# Patient Record
Sex: Female | Born: 1995 | Race: Black or African American | Hispanic: No | Marital: Single | Smoking: Never smoker
Health system: Southern US, Community
[De-identification: ages and names within clinical notes are randomized; demographics above are authoritative.]

## PROBLEM LIST (undated history)

## (undated) DIAGNOSIS — D649 Anemia, unspecified: Secondary | ICD-10-CM

---

## 2017-12-22 ENCOUNTER — Other Ambulatory Visit: Payer: Self-pay

## 2017-12-22 ENCOUNTER — Emergency Department (HOSPITAL_BASED_OUTPATIENT_CLINIC_OR_DEPARTMENT_OTHER)
Admission: EM | Admit: 2017-12-22 | Discharge: 2017-12-22 | Disposition: A | Discharge status: Home / Self Care | Payer: Self-pay | Attending: Emergency Medicine | Admitting: Emergency Medicine

## 2017-12-22 ENCOUNTER — Encounter (HOSPITAL_BASED_OUTPATIENT_CLINIC_OR_DEPARTMENT_OTHER): Payer: Self-pay

## 2017-12-22 ENCOUNTER — Emergency Department (HOSPITAL_BASED_OUTPATIENT_CLINIC_OR_DEPARTMENT_OTHER): Payer: Self-pay

## 2017-12-22 DIAGNOSIS — S0990XA Unspecified injury of head, initial encounter: Secondary | ICD-10-CM | POA: Insufficient documentation

## 2017-12-22 DIAGNOSIS — Y939 Activity, unspecified: Secondary | ICD-10-CM | POA: Insufficient documentation

## 2017-12-22 DIAGNOSIS — Y929 Unspecified place or not applicable: Secondary | ICD-10-CM | POA: Insufficient documentation

## 2017-12-22 DIAGNOSIS — Y999 Unspecified external cause status: Secondary | ICD-10-CM | POA: Insufficient documentation

## 2017-12-22 DIAGNOSIS — M791 Myalgia, unspecified site: Secondary | ICD-10-CM | POA: Insufficient documentation

## 2017-12-22 DIAGNOSIS — M7918 Myalgia, other site: Secondary | ICD-10-CM

## 2017-12-22 DIAGNOSIS — M545 Low back pain, unspecified: Secondary | ICD-10-CM

## 2017-12-22 DIAGNOSIS — M542 Cervicalgia: Secondary | ICD-10-CM | POA: Insufficient documentation

## 2017-12-22 LAB — PREGNANCY, URINE: PREG TEST UR: NEGATIVE

## 2017-12-22 MED ORDER — NAPROXEN 500 MG PO TABS: 500.0000 mg | ORAL_TABLET | Freq: Two times a day (BID) | ORAL | 0 refills | Status: DC
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
## 2019-01-24 ENCOUNTER — Emergency Department (HOSPITAL_COMMUNITY)
Admission: EM | Admit: 2019-01-24 | Discharge: 2019-01-24 | Discharge status: Against Medical Advice | Payer: Medicaid Other

## 2019-01-24 ENCOUNTER — Other Ambulatory Visit: Payer: Self-pay

## 2019-01-30 ENCOUNTER — Other Ambulatory Visit: Payer: Self-pay | Admitting: *Deleted

## 2019-01-30 DIAGNOSIS — Z20822 Contact with and (suspected) exposure to covid-19: Secondary | ICD-10-CM

## 2019-01-31 LAB — NOVEL CORONAVIRUS, NAA: SARS-CoV-2, NAA: NOT DETECTED
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
## 2021-04-02 ENCOUNTER — Emergency Department (HOSPITAL_BASED_OUTPATIENT_CLINIC_OR_DEPARTMENT_OTHER): Payer: Medicaid Other

## 2021-04-02 ENCOUNTER — Emergency Department (HOSPITAL_BASED_OUTPATIENT_CLINIC_OR_DEPARTMENT_OTHER)
Admission: EM | Admit: 2021-04-02 | Discharge: 2021-04-02 | Disposition: A | Discharge status: Home / Self Care | Payer: Medicaid Other | Attending: Emergency Medicine | Admitting: Emergency Medicine

## 2021-04-02 ENCOUNTER — Other Ambulatory Visit: Payer: Self-pay

## 2021-04-02 ENCOUNTER — Encounter (HOSPITAL_BASED_OUTPATIENT_CLINIC_OR_DEPARTMENT_OTHER): Payer: Self-pay | Admitting: *Deleted

## 2021-04-02 DIAGNOSIS — N938 Other specified abnormal uterine and vaginal bleeding: Secondary | ICD-10-CM | POA: Insufficient documentation

## 2021-04-02 DIAGNOSIS — Z332 Encounter for elective termination of pregnancy: Secondary | ICD-10-CM

## 2021-04-02 DIAGNOSIS — N939 Abnormal uterine and vaginal bleeding, unspecified: Secondary | ICD-10-CM

## 2021-04-02 DIAGNOSIS — O209 Hemorrhage in early pregnancy, unspecified: Secondary | ICD-10-CM | POA: Insufficient documentation

## 2021-04-02 DIAGNOSIS — Z3A01 Less than 8 weeks gestation of pregnancy: Secondary | ICD-10-CM | POA: Insufficient documentation

## 2021-04-02 DIAGNOSIS — N9489 Other specified conditions associated with female genital organs and menstrual cycle: Secondary | ICD-10-CM | POA: Diagnosis not present

## 2021-04-02 HISTORY — DX: Anemia, unspecified: D64.9

## 2021-04-02 LAB — HCG, QUANTITATIVE, PREGNANCY: hCG, Beta Chain, Quant, S: 11 m[IU]/mL — ABNORMAL HIGH (ref <5)

## 2021-04-02 LAB — PREGNANCY, URINE: Preg Test, Ur: NEGATIVE

## 2021-04-02 LAB — URINALYSIS, ROUTINE W REFLEX MICROSCOPIC
Bilirubin Urine: NEGATIVE
Glucose, UA: NEGATIVE mg/dL
Ketones, ur: NEGATIVE mg/dL
Nitrite: NEGATIVE
Protein, ur: 30 mg/dL — AB
Specific Gravity, Urine: 1.015 (ref 1.005–1.030)
pH: 7 (ref 5.0–8.0)

## 2021-04-02 LAB — CBC WITH DIFFERENTIAL/PLATELET
Abs Immature Granulocytes: 0.02 10*3/uL (ref 0.00–0.07)
Basophils Absolute: 0.1 10*3/uL (ref 0.0–0.1)
Basophils Relative: 1 %
Eosinophils Absolute: 0.1 10*3/uL (ref 0.0–0.5)
Eosinophils Relative: 2 %
HCT: 33 % — ABNORMAL LOW (ref 36.0–46.0)
Hemoglobin: 9.9 g/dL — ABNORMAL LOW (ref 12.0–15.0)
Immature Granulocytes: 0 %
Lymphocytes Relative: 34 %
Lymphs Abs: 2.4 10*3/uL (ref 0.7–4.0)
MCH: 22.2 pg — ABNORMAL LOW (ref 26.0–34.0)
MCHC: 30 g/dL (ref 30.0–36.0)
MCV: 74 fL — ABNORMAL LOW (ref 80.0–100.0)
Monocytes Absolute: 0.6 10*3/uL (ref 0.1–1.0)
Monocytes Relative: 8 %
Neutro Abs: 3.9 10*3/uL (ref 1.7–7.7)
Neutrophils Relative %: 55 %
Platelets: 230 10*3/uL (ref 150–400)
RBC: 4.46 MIL/uL (ref 3.87–5.11)
RDW: 16.7 % — ABNORMAL HIGH (ref 11.5–15.5)
WBC: 7.1 10*3/uL (ref 4.0–10.5)
nRBC: 0 % (ref 0.0–0.2)

## 2021-04-02 LAB — URINALYSIS, MICROSCOPIC (REFLEX): RBC / HPF: >50 RBC/hpf (ref 0–5)

## 2021-04-02 LAB — COMPREHENSIVE METABOLIC PANEL
ALT: 10 U/L (ref 0–44)
AST: 16 U/L (ref 15–41)
Albumin: 3.5 g/dL (ref 3.5–5.0)
Alkaline Phosphatase: 38 U/L (ref 38–126)
Anion gap: 7 (ref 5–15)
BUN: 10 mg/dL (ref 6–20)
CO2: 24 mmol/L (ref 22–32)
Calcium: 8.2 mg/dL — ABNORMAL LOW (ref 8.9–10.3)
Chloride: 103 mmol/L (ref 98–111)
Creatinine, Ser: 0.63 mg/dL (ref 0.44–1.00)
GFR, Estimated: >60 mL/min (ref >60)
Glucose, Bld: 86 mg/dL (ref 70–99)
Potassium: 3.6 mmol/L (ref 3.5–5.1)
Sodium: 134 mmol/L — ABNORMAL LOW (ref 135–145)
Total Bilirubin: 0.6 mg/dL (ref 0.3–1.2)
Total Protein: 7.2 g/dL (ref 6.5–8.1)

## 2021-04-02 NOTE — ED Provider Notes (Signed)
[facility] EMERGENCY DEPARTMENT Provider Note   CSN: [id number] Arrival date & time: 04/02/21  1658     History Chief Complaint  Patient presents with   Vaginal Bleeding    Veronica Blake is a 25 y.o. female.  HPI Patient is a 25 year old female with past medical history significant for anemia  Patient is presented to ER today with complaints of vaginal bleeding for 7 weeks she states that she had a medical abortion 7 weeks ago at a clinic in [place] and states that she called the clinic today because of her ongoing mild vaginal bleeding with occasional clots passing and states that she was told to come to the emergency room.  She denies any pain lightheadedness dizziness nausea vomiting abdominal pain she denies any syncope or near syncope.  No other associated symptoms.  She states she feels well.  No urinary symptoms such as hematuria urgency or frequency.  She also denies any changes in her bowel movements.  She states that she has a history of anemia and her hemoglobin baseline is around 8.     Past Medical History:  Diagnosis Date   Anemia     There are no problems to display for this patient.   History reviewed. No pertinent surgical history.   OB History   No obstetric history on file.     No family history on file.  Social History   Tobacco Use   Smoking status: Never   Smokeless tobacco: Never  Substance Use Topics   Alcohol use: Never   Drug use: Never    Home Medications Prior to Admission medications   Medication Sig Start Date End Date Taking? Authorizing Provider  naproxen (NAPROSYN) 500 MG tablet Take 1 tablet (500 mg total) by mouth 2 (two) times daily with a meal. 12/22/17   Caccavale, Sophia, PA-C    Allergies    Patient has no known allergies.  Review of Systems   Review of Systems  Constitutional:  Negative for chills and fever.  HENT:  Negative for congestion.   Eyes:  Negative for pain.  Respiratory:  Negative for  cough and shortness of breath.   Cardiovascular:  Negative for chest pain and leg swelling.  Gastrointestinal:  Negative for abdominal pain and vomiting.  Genitourinary:  Positive for vaginal bleeding. Negative for dysuria.  Musculoskeletal:  Negative for myalgias.  Skin:  Negative for rash.  Neurological:  Negative for dizziness and headaches.   Physical Exam Updated Vital Signs BP 120/68   Pulse 68   Temp 98.4 F (36.9 C) (Oral)   Resp 16   Ht 5\' 3"  (1.6 m)   Wt 56.7 kg   SpO2 99%   BMI 22.14 kg/m   Physical Exam Vitals and nursing note reviewed.  Constitutional:      General: She is not in acute distress. HENT:     Head: Normocephalic and atraumatic.     Nose: Nose normal.  Eyes:     General: No scleral icterus. Cardiovascular:     Rate and Rhythm: Normal rate and regular rhythm.     Pulses: Normal pulses.     Heart sounds: Normal heart sounds.  Pulmonary:     Effort: Pulmonary effort is normal. No respiratory distress.     Breath sounds: No wheezing.  Abdominal:     Palpations: Abdomen is soft.     Tenderness: There is no abdominal tenderness. There is no guarding or rebound.  Genitourinary:    Comments: Deferred by  patient Musculoskeletal:     Cervical back: Normal range of motion.     Right lower leg: No edema.     Left lower leg: No edema.  Skin:    General: Skin is warm and dry.     Capillary Refill: Capillary refill takes less than 2 seconds.  Neurological:     Mental Status: She is alert. Mental status is at baseline.  Psychiatric:        Mood and Affect: Mood normal.        Behavior: Behavior normal.    ED Results / Procedures / Treatments   Labs (all labs ordered are listed, but only abnormal results are displayed) Labs Reviewed  URINALYSIS, ROUTINE W REFLEX MICROSCOPIC - Abnormal; Notable for the following components:      Result Value   Color, Urine RED (*)    APPearance TURBID (*)    Hgb urine dipstick LARGE (*)    Protein, ur 30 (*)     Leukocytes,Ua TRACE (*)    All other components within normal limits  HCG, QUANTITATIVE, PREGNANCY - Abnormal; Notable for the following components:   hCG, Beta Chain, Quant, S 11 (*)    All other components within normal limits  CBC WITH DIFFERENTIAL/PLATELET - Abnormal; Notable for the following components:   Hemoglobin 9.9 (*)    HCT 33.0 (*)    MCV 74.0 (*)    MCH 22.2 (*)    RDW 16.7 (*)    All other components within normal limits  COMPREHENSIVE METABOLIC PANEL - Abnormal; Notable for the following components:   Sodium 134 (*)    Calcium 8.2 (*)    All other components within normal limits  URINALYSIS, MICROSCOPIC (REFLEX) - Abnormal; Notable for the following components:   Bacteria, UA RARE (*)    All other components within normal limits  PREGNANCY, URINE    EKG None  Radiology US Transvaginal Non-OB  Result Date: 04/02/2021 CLINICAL DATA:  Recent abortion with persistent bleeding. EXAM: Transvaginal ULTRASOUND OF PELVIS TECHNIQUE: Transvaginal ultrasound examination of the pelvis was performed including evaluation of the uterus, ovaries, adnexal regions, and pelvic cul-de-sac. COMPARISON:  None. FINDINGS: Uterus Measurements: 8.7 x 4.9 x 7.1 cm = volume: 160 mL. There is some vague focal hypodensity within the posterior myometrium which may be causing some indentation on the endometrium measuring approximally 3.0 x 1.5 x 2.5 cm. There is marked increased vascularity in this region. Endometrium Thickness: 6 mm.  No focal abnormality visualized. Right ovary Measurements: 3.1 x 1.5 x 2.0 cm = volume: 4.8 mL. Normal appearance/no adnexal mass. Left ovary Measurements: 4.0 x 2.3 x 2.1 cm = volume: 9.7 mL. Normal appearance/no adnexal mass. Other findings:  No abnormal free fluid. IMPRESSION: 1. Vague hypodensity within the posterior myometrium causing mass effect/indentation on the endometrium with marked increased vascularity. Findings may be related to uterine AVM. Gynecology  consultation recommended. 2. Endometrium is thin and otherwise appears within normal limits. 3. These results were called by telephone at the time of interpretation on 04/02/2021 at 8:04 pm to provider Dr.Dykstra, who verbally acknowledged these results. Electronically Signed   By: Darliss Cheney M.D.   On: 04/02/2021 20:05    Procedures Procedures   Medications Ordered in ED Medications - No data to display  ED Course  I have reviewed the triage vital signs and the nursing notes.  Pertinent labs & imaging results that were available during my care of the patient were reviewed by me and considered in  my medical decision making (see chart for details).    MDM Rules/Calculators/A&P                          Patient is 25 year old female no past surgical history has had a medical abortion 7 weeks ago and has had some mild continuous bleeding since that time she states occasionally she will pass large clots.  Denies any symptoms other than vaginal bleeding.  Denies any pain nausea vomiting or urinary symptoms such as urinary frequency urgency dysuria hematuria.  Physical exam unremarkable abdomen soft nontender.  Pelvic exam deferred by patient.  Pelvic ultrasound shows uterine AVM.  Patient may need interventional radiology down the road however can be discharged home at this time.  CBC shows anemia however actually hemoglobin is higher than her baseline which is 8 per patient I reviewed prior labs and see that she has had chronic anemia.  She is also having no lightheadedness dizziness shortness of breath chest pain or fatigue.  She states she feels well.  Physical exam is unremarkable.  CMP unremarkable.  Urinalysis consistent with vaginal bleeding with large hemoglobin and red color.  Discussed with Dr. Harolyn Rutherford of OB/GYN who recommended discharge home with follow-up with [facility]  Final Clinical Impression(s) / ED Diagnoses Final diagnoses:  Vaginal  bleeding  Medical abortion    Rx / DC Orders ED Discharge Orders     None        Tedd Sias, [place] 04/02/21 2036    Lucrezia Starch, MD 04/02/21 2133

## 2021-04-02 NOTE — Discharge Instructions (Addendum)
Your ultrasound shows that you have an abnormal vascular lesion in your uterus.  This is likely been here for some time.  You will need to follow-up with the OB/GYN I have given you information for the office here at [facility].  Please call tomorrow to make an appointment  Please take ibuprofen 600 mg every 6-8 hours.

## 2021-04-02 NOTE — ED Notes (Signed)
US bedside

## 2021-04-02 NOTE — ED Triage Notes (Addendum)
C/o  light vaginal bleeding  with clots x 7 weeks  after elective abortion @  22 weeks preg

## 2021-04-03 ENCOUNTER — Other Ambulatory Visit: Payer: Self-pay

## 2021-05-02 ENCOUNTER — Encounter (HOSPITAL_COMMUNITY): Payer: Self-pay | Admitting: Radiology

## 2021-05-07 ENCOUNTER — Encounter: Payer: Medicaid Other | Admitting: Obstetrics & Gynecology

## 2021-08-13 ENCOUNTER — Encounter (HOSPITAL_COMMUNITY): Payer: Self-pay | Admitting: Emergency Medicine

## 2021-08-13 ENCOUNTER — Other Ambulatory Visit: Payer: Self-pay

## 2021-08-13 ENCOUNTER — Emergency Department (HOSPITAL_COMMUNITY)
Admission: EM | Admit: 2021-08-13 | Discharge: 2021-08-13 | Disposition: A | Discharge status: Home / Self Care | Payer: Medicaid Other | Attending: Emergency Medicine | Admitting: Emergency Medicine

## 2021-08-13 DIAGNOSIS — O219 Vomiting of pregnancy, unspecified: Secondary | ICD-10-CM | POA: Diagnosis not present

## 2021-08-13 DIAGNOSIS — R7989 Other specified abnormal findings of blood chemistry: Secondary | ICD-10-CM | POA: Diagnosis not present

## 2021-08-13 DIAGNOSIS — Z3A01 Less than 8 weeks gestation of pregnancy: Secondary | ICD-10-CM | POA: Diagnosis not present

## 2021-08-13 DIAGNOSIS — O26891 Other specified pregnancy related conditions, first trimester: Secondary | ICD-10-CM | POA: Diagnosis not present

## 2021-08-13 DIAGNOSIS — K0889 Other specified disorders of teeth and supporting structures: Secondary | ICD-10-CM | POA: Insufficient documentation

## 2021-08-13 LAB — COMPREHENSIVE METABOLIC PANEL
ALT: 11 U/L (ref 0–44)
AST: 18 U/L (ref 15–41)
Albumin: 3.5 g/dL (ref 3.5–5.0)
Alkaline Phosphatase: 36 U/L — ABNORMAL LOW (ref 38–126)
Anion gap: 7 (ref 5–15)
BUN: 10 mg/dL (ref 6–20)
CO2: 20 mmol/L — ABNORMAL LOW (ref 22–32)
Calcium: 8.5 mg/dL — ABNORMAL LOW (ref 8.9–10.3)
Chloride: 108 mmol/L (ref 98–111)
Creatinine, Ser: 0.71 mg/dL (ref 0.44–1.00)
GFR, Estimated: >60 mL/min (ref >60)
Glucose, Bld: 94 mg/dL (ref 70–99)
Potassium: 3.5 mmol/L (ref 3.5–5.1)
Sodium: 135 mmol/L (ref 135–145)
Total Bilirubin: 0.5 mg/dL (ref 0.3–1.2)
Total Protein: 6.9 g/dL (ref 6.5–8.1)

## 2021-08-13 LAB — CBC WITH DIFFERENTIAL/PLATELET
Abs Immature Granulocytes: 0.03 10*3/uL (ref 0.00–0.07)
Basophils Absolute: 0.1 10*3/uL (ref 0.0–0.1)
Basophils Relative: 1 %
Eosinophils Absolute: 0.2 10*3/uL (ref 0.0–0.5)
Eosinophils Relative: 3 %
HCT: 27.7 % — ABNORMAL LOW (ref 36.0–46.0)
Hemoglobin: 8.1 g/dL — ABNORMAL LOW (ref 12.0–15.0)
Immature Granulocytes: 0 %
Lymphocytes Relative: 29 %
Lymphs Abs: 2.3 10*3/uL (ref 0.7–4.0)
MCH: 19 pg — ABNORMAL LOW (ref 26.0–34.0)
MCHC: 29.2 g/dL — ABNORMAL LOW (ref 30.0–36.0)
MCV: 64.9 fL — ABNORMAL LOW (ref 80.0–100.0)
Monocytes Absolute: 0.8 10*3/uL (ref 0.1–1.0)
Monocytes Relative: 10 %
Neutro Abs: 4.5 10*3/uL (ref 1.7–7.7)
Neutrophils Relative %: 57 %
Platelets: 232 10*3/uL (ref 150–400)
RBC: 4.27 MIL/uL (ref 3.87–5.11)
RDW: 19.2 % — ABNORMAL HIGH (ref 11.5–15.5)
Smear Review: ADEQUATE
WBC: 7.9 10*3/uL (ref 4.0–10.5)
nRBC: 0 % (ref 0.0–0.2)

## 2021-08-13 LAB — URINALYSIS, ROUTINE W REFLEX MICROSCOPIC
Bilirubin Urine: NEGATIVE
Glucose, UA: NEGATIVE mg/dL
Hgb urine dipstick: NEGATIVE
Ketones, ur: NEGATIVE mg/dL
Nitrite: NEGATIVE
Protein, ur: NEGATIVE mg/dL
Specific Gravity, Urine: 1.027 (ref 1.005–1.030)
pH: 5 (ref 5.0–8.0)

## 2021-08-13 LAB — I-STAT BETA HCG BLOOD, ED (MC, WL, AP ONLY): I-stat hCG, quantitative: >2000 m[IU]/mL — ABNORMAL HIGH (ref <5)

## 2021-08-13 LAB — LIPASE, BLOOD: Lipase: 38 U/L (ref 11–51)

## 2021-08-13 LAB — HCG, QUANTITATIVE, PREGNANCY: hCG, Beta Chain, Quant, S: 39137 m[IU]/mL — ABNORMAL HIGH (ref <5)

## 2021-08-13 MED ORDER — AMOXICILLIN-POT CLAVULANATE 875-125 MG PO TABS
1.0000 | ORAL_TABLET | Freq: Once | ORAL | Status: AC
  Administered 2021-08-13: 1 via ORAL
  Filled 2021-08-13: qty 1

## 2021-08-13 MED ORDER — ONDANSETRON 4 MG PO TBDP
4.0000 mg | ORAL_TABLET | Freq: Once | ORAL | Status: AC
  Administered 2021-08-13: 4 mg via ORAL
  Filled 2021-08-13: qty 1

## 2021-08-13 MED ORDER — DOXYLAMINE-PYRIDOXINE 10-10 MG PO TBEC: 1.0000 | DELAYED_RELEASE_TABLET | Freq: Every day | ORAL | 0 refills | Status: AC | PRN

## 2021-08-13 MED ORDER — AMOXICILLIN-POT CLAVULANATE 875-125 MG PO TABS: 1.0000 | ORAL_TABLET | Freq: Two times a day (BID) | ORAL | 0 refills | Status: DC

## 2021-08-13 NOTE — Discharge Instructions (Addendum)
You were seen here for evaluation of your dental pain. It is likely from your tooth that needs a root canal. I am prescribing you an antibiotic to take twice daily for the next week. You can take 1000mg  of Tylenol every 6 hours as needed for pain. Both of these should help with the inflammation and pain you are experiencing. Please schedule to see a dentist as they will be able to provide follow up and care. I have also sent in a prescription for Diclegis which is a medication for nausea and vomiting. I have provided some dental resources that you can call to schedule your procedure. If you have any trouble eating, swallowing, breathing, facial swelling, or drooling, please return to the ER for re-evaluation.  ? ?Contact a dental care provider if: ?You have any unexplained dental pain. ?Your pain is not controlled with medicines. ?Your symptoms get worse. ?You have new symptoms. ?Get help right away if: ?You are unable to open your mouth. ?You are having trouble breathing or swallowing. ?You have a fever. ?You notice that your face, neck, or jaw is swollen. ?These symptoms may represent a serious problem that is an emergency. Do not wait to see if the symptoms will go away. Get medical help right away. Call your local emergency services (911 in the [place] Do not drive yourself to the hospital. ?

## 2021-08-13 NOTE — ED Provider Triage Note (Signed)
Emergency Medicine Provider Triage Evaluation Note ? ?Veronica Blake , a 26 y.o. female  was evaluated in triage.  Pt complains of left sided dental pain.  States saw dentist already and is waiting for root canal.  Not currently on abx.  Also reports now she is vomiting.  Denies sick contacts.  Not sure why she is vomiting.  Denies fever/chills. ? ? ?Review of Systems  ?Positive: Dental pain, vomiting ?Negative: fever ? ?Physical Exam  ?BP 127/87   Pulse 77   Temp 98.7 ?F (37.1 ?C) (Oral)   Resp 18   SpO2 100%  ? ?Gen:   Awake, no distress   ?Resp:  Normal effort  ?MSK:   Moves extremities without difficulty  ?Other:  Dentition poor, left upper gingival swelling without localized dental abscess ? ?Medical Decision Making  ?Medically screening exam initiated at 4:16 AM.  Appropriate orders placed.  Veronica Blake was informed that the remainder of the evaluation will be completed by another provider, this initial triage assessment does not replace that evaluation, and the importance of remaining in the ED until their evaluation is complete. ? ?Dental pain, vomiting.  After initial triage, patient states she is approx [redacted] weeks pregnant but planning for abortion.  LMP 06/29/21. ?  ?Garlon Hatchet, PA-C ?08/13/21 [phone] ? ?

## 2021-08-13 NOTE — ED Notes (Signed)
Pt endorses pregnancy. LMP was 06/29/2021. States she does not plan to carry out pregnancy.  ?

## 2021-08-13 NOTE — ED Triage Notes (Signed)
Pt reported to ED with c/o lower left dental pain. Pt states she has also vomited. Denies any sick contacts.  ?

## 2021-08-13 NOTE — ED Provider Notes (Signed)
?[facility] ?Provider Note ? ? ?CSN: [id number] ?Arrival date & time: 08/13/21  0344 ? ?  ? ?History ?Chief Complaint  ?Patient presents with  ? Dental Pain  ? Vomiting  ? ? ?Veronica Blake is a 26 y.o. pregnant female  of unknown gestational age (last menstrual period mid February) presents to the ED for evaluation of dental pain intermittently for the past month, but worsened yesterday. She was seen by a dentist over a month ago and was told that she needed a root canal, but reports it was thousands of dollars that she could not afford. She reports that she does not have the pain every day, only every other day if that.  She reports she tried some extra and Tylenol as well as some Advil and a Goody's powder yesterday with only minimal relief.  She reports she was told to go to go the emergency department if she was having any pain that was not improved by Tylenol by her dentist.  She denies any fevers, trouble eating or drinking, trouble swallowing, trouble breathing, any foul taste. She does mention she had two episodes of vomiting, but denies any nausea, abdominal pain, vaginal bleeding, or loss of fluid. She reports that she feels fine after the Zofran she received in triage.  No known drug allergies. ? ? ?Dental Pain ?Associated symptoms: no fever   ? ?  ? ?Home Medications ?Prior to Admission medications   ?Medication Sig Start Date End Date Taking? Authorizing Provider  ?acetaminophen (TYLENOL) 500 MG tablet Take 1,000 mg by mouth every 6 (six) hours as needed for mild pain.   Yes [provider]  ?amoxicillin-clavulanate (AUGMENTIN) 875-125 MG tablet Take 1 tablet by mouth every 12 (twelve) hours. 08/13/21  Yes Sherrell Puller, PA-C  ?Doxylamine-Pyridoxine (DICLEGIS) 10-10 MG TBEC Take 1 tablet by mouth daily as needed. 08/13/21  Yes Sherrell Puller, PA-C  ?   ? ?Allergies    ?Patient has no known allergies.   ? ?Review of Systems   ?Review of Systems  ?Constitutional:   Negative for fever.  ?HENT:  Positive for dental problem. Negative for sore throat and trouble swallowing.   ?Respiratory:  Negative for shortness of breath.   ?Gastrointestinal:  Positive for vomiting. Negative for abdominal pain, constipation and nausea.  ?Genitourinary:  Negative for dysuria, hematuria, pelvic pain, vaginal bleeding, vaginal discharge and vaginal pain.  ? ?Physical Exam ?Updated Vital Signs ?BP 122/80 (BP Location: Right Arm)   Pulse 75   Temp 98.5 ?F (36.9 ?C) (Oral)   Resp 16   Ht $R'5\' 3"'Yu$  (1.6 m)   Wt 61.2 kg   SpO2 100%   BMI 23.91 kg/m?  ?Physical Exam ?Vitals and nursing note reviewed.  ?Constitutional:   ?   General: She is not in acute distress. ?   Appearance: Normal appearance. She is not ill-appearing or toxic-appearing.  ?HENT:  ?   Nose: Nose normal.  ?   Mouth/Throat:  ?   Mouth: Mucous membranes are moist.  ?   Pharynx: No oropharyngeal exudate or posterior oropharyngeal erythema.  ?   Comments: Poor dentition without any obvious fracture to a tooth. No gingival swelling. No palpable abscess or induration. No erythema noted. Dentition non-tender to palpation. No trismus.  Moist mucous membranes.  Uvula midline.  No soft palate deviation.  Airway patent.  Patient is able to speak in full sentences with ease. ?Eyes:  ?   General: No scleral icterus. ?Cardiovascular:  ?  Rate and Rhythm: Normal rate.  ?Pulmonary:  ?   Effort: Pulmonary effort is normal. No respiratory distress.  ?Musculoskeletal:  ?   Cervical back: Normal range of motion and neck supple. No tenderness.  ?Lymphadenopathy:  ?   Cervical: No cervical adenopathy.  ?Skin: ?   General: Skin is dry.  ?   Findings: No rash.  ?Neurological:  ?   General: No focal deficit present.  ?   Mental Status: She is alert. Mental status is at baseline.  ?Psychiatric:     ?   Mood and Affect: Mood normal.  ? ? ?ED Results / Procedures / Treatments   ?Labs ?(all labs ordered are listed, but only abnormal results are displayed) ?Labs  Reviewed  ?CBC WITH DIFFERENTIAL/PLATELET - Abnormal; Notable for the following components:  ?    Result Value  ? Hemoglobin 8.1 (*)   ? HCT 27.7 (*)   ? MCV 64.9 (*)   ? MCH 19.0 (*)   ? MCHC 29.2 (*)   ? RDW 19.2 (*)   ? All other components within normal limits  ?COMPREHENSIVE METABOLIC PANEL - Abnormal; Notable for the following components:  ? CO2 20 (*)   ? Calcium 8.5 (*)   ? Alkaline Phosphatase 36 (*)   ? All other components within normal limits  ?URINALYSIS, ROUTINE W REFLEX MICROSCOPIC - Abnormal; Notable for the following components:  ? APPearance HAZY (*)   ? Leukocytes,Ua SMALL (*)   ? Bacteria, UA RARE (*)   ? All other components within normal limits  ?HCG, QUANTITATIVE, PREGNANCY - Abnormal; Notable for the following components:  ? hCG, Beta Chain, Quant, S 39,137 (*)   ? All other components within normal limits  ?I-STAT BETA HCG BLOOD, ED ([facility], [facility], [facility] ONLY) - Abnormal; Notable for the following components:  ? I-stat hCG, quantitative >2,000.0 (*)   ? All other components within normal limits  ?LIPASE, BLOOD  ? ? ?EKG ?None ? ?Radiology ?No results found. ? ?Procedures ?Procedures  ? ?Medications Ordered in ED ?Medications  ?ondansetron (ZOFRAN-ODT) disintegrating tablet 4 mg (4 mg Oral Given 08/13/21 0437)  ?amoxicillin-clavulanate (AUGMENTIN) 875-125 MG per tablet 1 tablet (1 tablet Oral Given 08/13/21 0747)  ? ? ?ED Course/ Medical Decision Making/ A&P ?  ?                        ?Medical Decision Making ?Amount and/or Complexity of Data Reviewed ?Labs: ordered. ? ? ?26 year old female presents to the emergency department for evaluation of dental pain onset yesterday but intermittently for the past month.  Differential diagnosis includes was not limited to dental pain versus PTA versus dental abscess versus mastoiditis.  Vital signs are unremarkable.  Physical exam is unremarkable as well, uvula midline, airway patent with moist mucous membranes.  She has nontender dentition although it is poor.   There is no obvious signs of abscess or induration.  No soft palate deviation.  No sign of Ludwig's.  The patient did have 2 episodes of vomiting and labs were ordered in triage.  The patient had a positive pregnancy test, reports that she knows that she is pregnant but does not know how she is as she is going to terminate the pregnancy at the end of the week. ? ?I independently reviewed and interpreted the patient's labs.  CMP shows mild decrease in bicarb at 20.  Mild decrease in calcium at 8.5.  Mom alk phos decreased at 36.  Otherwise no electrolyte abnormalities.  Urinalysis shows hazy urine with small leuks and rare bacteria although there is 21-50 squamous cells present so likely dirty catch.  Her lipase is 38.  CBC shows hemoglobin of 8.1 although the patient reports that her baseline is around 8. hCG 39,137.  ? ?We will give her first dose of Augmentin here and send her home with a prescription for that as well.  We will send her home with a prescription for Diclegis as well.  I informed her that NSAIDs were not safe in pregnancy although I know she is going in for a medical abortion at the end of the week.  Patient reports she understands. Gave dental resources. Return precautions discussed. Patient agrees to plan. The patient is stable and being sent home in good condition.  ? ?Final Clinical Impression(s) / ED Diagnoses ?Final diagnoses:  ?Pain, dental  ? ? ?Rx / DC Orders ?ED Discharge Orders   ? ?      Ordered  ?  amoxicillin-clavulanate (AUGMENTIN) 875-125 MG tablet  Every 12 hours       ? 08/13/21 0740  ?  Doxylamine-Pyridoxine (DICLEGIS) 10-10 MG TBEC  Daily PRN       ? 08/13/21 0747  ? ?  ?  ? ?  ? ? ?  ?Sherrell Puller, PA-C ?08/13/21 3729 ? ?  ?Lennice Sites, DO ?08/13/21 0211 ? ?

## 2022-01-02 ENCOUNTER — Other Ambulatory Visit: Payer: Self-pay

## 2022-01-02 ENCOUNTER — Encounter (HOSPITAL_COMMUNITY): Payer: Self-pay | Admitting: Emergency Medicine

## 2022-01-02 ENCOUNTER — Emergency Department (HOSPITAL_COMMUNITY)
Admission: EM | Admit: 2022-01-02 | Discharge: 2022-01-02 | Discharge status: Against Medical Advice | Payer: Medicaid Other | Attending: Emergency Medicine | Admitting: Emergency Medicine

## 2022-01-02 DIAGNOSIS — K0889 Other specified disorders of teeth and supporting structures: Secondary | ICD-10-CM | POA: Diagnosis present

## 2022-01-02 DIAGNOSIS — R6884 Jaw pain: Secondary | ICD-10-CM | POA: Diagnosis not present

## 2022-01-02 DIAGNOSIS — Z5321 Procedure and treatment not carried out due to patient leaving prior to being seen by health care provider: Secondary | ICD-10-CM | POA: Insufficient documentation

## 2022-01-02 MED ORDER — OXYCODONE-ACETAMINOPHEN 5-325 MG PO TABS
1.0000 | ORAL_TABLET | Freq: Once | ORAL | Status: AC
  Administered 2022-01-02: 1 via ORAL
  Filled 2022-01-02: qty 1

## 2022-01-02 NOTE — ED Triage Notes (Signed)
Patient reports chronic left lower molar pain radiating to left lower jaw since February this year .

## 2022-01-02 NOTE — ED Notes (Signed)
Patient did not respond when name was called for room.

## 2022-01-14 ENCOUNTER — Other Ambulatory Visit: Payer: Self-pay

## 2022-01-14 ENCOUNTER — Emergency Department (HOSPITAL_BASED_OUTPATIENT_CLINIC_OR_DEPARTMENT_OTHER)
Admission: EM | Admit: 2022-01-14 | Discharge: 2022-01-14 | Disposition: A | Discharge status: Home / Self Care | Payer: Medicaid Other | Attending: Emergency Medicine | Admitting: Emergency Medicine

## 2022-01-14 ENCOUNTER — Encounter (HOSPITAL_BASED_OUTPATIENT_CLINIC_OR_DEPARTMENT_OTHER): Payer: Self-pay | Admitting: Emergency Medicine

## 2022-01-14 DIAGNOSIS — K047 Periapical abscess without sinus: Secondary | ICD-10-CM | POA: Diagnosis not present

## 2022-01-14 DIAGNOSIS — K0889 Other specified disorders of teeth and supporting structures: Secondary | ICD-10-CM | POA: Diagnosis present

## 2022-01-14 DIAGNOSIS — G8929 Other chronic pain: Secondary | ICD-10-CM | POA: Insufficient documentation

## 2022-01-14 MED ORDER — AMOXICILLIN-POT CLAVULANATE 875-125 MG PO TABS
1.0000 | ORAL_TABLET | Freq: Once | ORAL | Status: AC
  Administered 2022-01-14: 1 via ORAL
  Filled 2022-01-14: qty 1

## 2022-01-14 MED ORDER — NAPROXEN 250 MG PO TABS
500.0000 mg | ORAL_TABLET | Freq: Once | ORAL | Status: AC
  Administered 2022-01-14: 500 mg via ORAL
  Filled 2022-01-14: qty 2

## 2022-01-14 MED ORDER — AMOXICILLIN-POT CLAVULANATE 875-125 MG PO TABS: 1.0000 | ORAL_TABLET | Freq: Two times a day (BID) | ORAL | 0 refills | Status: AC

## 2022-01-14 NOTE — ED Triage Notes (Signed)
Patient arrived via POV c/o dental abscess to left lower jaw. Patient to be seen by dentist on 8/31. Patient states 10/10 pain and unable to eat. Patient is AO x 4, VS WDL, normal gait.

## 2022-01-14 NOTE — ED Provider Notes (Signed)
   [facility] EMERGENCY DEPARTMENT  Provider Note  CSN: [id number] Arrival date & time: 01/14/22 0003  History Chief Complaint  Patient presents with   Dental Pain    Veronica Blake is a 26 y.o. female with chronic dental pain reports increased pain in L lower molar for the last month, scheduled to see a dentist in 2 days but pain has been more severe today and was advised to come to the ED. Pain is worse with opening her jaw. Radiates into her L face. No fevers. No drainage.    Home Medications Prior to Admission medications   Medication Sig Start Date End Date Taking? Authorizing Provider  acetaminophen (TYLENOL) 500 MG tablet Take 1,000 mg by mouth every 6 (six) hours as needed for mild pain.    [provider]  amoxicillin-clavulanate (AUGMENTIN) 875-125 MG tablet Take 1 tablet by mouth every 12 (twelve) hours. 08/13/21   Achille Rich, PA-C  Doxylamine-Pyridoxine (DICLEGIS) 10-10 MG TBEC Take 1 tablet by mouth daily as needed. 08/13/21   Achille Rich, PA-C     Allergies    Patient has no known allergies.   Review of Systems   Review of Systems Please see HPI for pertinent positives and negatives  Physical Exam BP 128/88 (BP Location: Right Arm)   Pulse 85   Temp 97.8 F (36.6 C)   Resp 18   Ht 5\' 4"  (1.626 m)   Wt 59 kg   LMP  (LMP Unknown)   SpO2 100%   BMI 22.31 kg/m   Physical Exam Vitals and nursing note reviewed.  HENT:     Head: Normocephalic.     Nose: Nose normal.     Mouth/Throat:     Comments: Dental caries in L lower 2nd molar, L lower 3rd molar is surgically absent. No focal fluctuance or signs of drainable abscess or deep tissue infection Eyes:     Extraocular Movements: Extraocular movements intact.  Pulmonary:     Effort: Pulmonary effort is normal.  Musculoskeletal:        General: Normal range of motion.     Cervical back: Neck supple.  Skin:    Findings: No rash (on exposed skin).  Neurological:     Mental Status: She  is alert and oriented to person, place, and time.  Psychiatric:        Mood and Affect: Mood normal.     ED Results / Procedures / Treatments   EKG None  Procedures Procedures  Medications Ordered in the ED Medications  amoxicillin-clavulanate (AUGMENTIN) 875-125 MG per tablet 1 tablet (has no administration in time range)  naproxen (NAPROSYN) tablet 500 mg (has no administration in time range)    Initial Impression and Plan  Plan NSAIDs for pain, she is not currently pregnant. Augmentin for infection.   ED Course       MDM Rules/Calculators/A&P Medical Decision Making Problems Addressed: Dental infection: chronic illness or injury with exacerbation, progression, or side effects of treatment  Risk Prescription drug management.    Final Clinical Impression(s) / ED Diagnoses Final diagnoses:  Dental infection    Rx / DC Orders ED Discharge Orders     None        , MD 01/14/22 [phone]

## 2022-05-26 ENCOUNTER — Ambulatory Visit: Payer: Medicaid Other | Admitting: Family Medicine

## 2022-05-26 NOTE — Progress Notes (Deleted)
Patient name: Veronica Blake Medical record number: [id number] Date of Birth: 10-05-1995 Age: 27 y.o. Gender: female  Veronica Blake is a 27 y.o. female here to establish care.   Subjective  Concerns today:  Social history: Prior PCP:  Last went to doctor: Occupation:  Lives with:  Smoking/Vaping:   Alcohol:  Marijuana/ilicit substances:   Exercise:    Allergies:  Daily medications:  Medical problems:  Surgical history: Family history:   Health Maintenance:   Objective  There were no vitals taken for this visit.  Physical Exam  Assessment and Plan  There are no diagnoses linked to this encounter. No follow-ups on file.  Salvadore Oxford, MD, PGY-1 [facility] 11:41 AM 05/26/2022
